# Patient Record
Sex: Male | Born: 1985 | Race: Black or African American | Hispanic: No | Marital: Single | Smoking: Former smoker
Health system: Southern US, Community
[De-identification: ages and names within clinical notes are randomized; demographics above are authoritative.]

---

## 2009-11-06 ENCOUNTER — Emergency Department (HOSPITAL_BASED_OUTPATIENT_CLINIC_OR_DEPARTMENT_OTHER): Admission: EM | Admit: 2009-11-06 | Discharge: 2009-11-06 | Payer: Self-pay | Admitting: Emergency Medicine

## 2009-11-06 ENCOUNTER — Ambulatory Visit: Payer: Self-pay | Admitting: Radiology
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
## 2015-01-29 ENCOUNTER — Encounter (HOSPITAL_BASED_OUTPATIENT_CLINIC_OR_DEPARTMENT_OTHER): Payer: Self-pay | Admitting: Emergency Medicine

## 2015-01-29 ENCOUNTER — Emergency Department (HOSPITAL_BASED_OUTPATIENT_CLINIC_OR_DEPARTMENT_OTHER)
Admission: EM | Admit: 2015-01-29 | Discharge: 2015-01-29 | Disposition: A | Discharge status: Home / Self Care | Payer: Self-pay | Attending: Emergency Medicine | Admitting: Emergency Medicine

## 2015-01-29 DIAGNOSIS — R197 Diarrhea, unspecified: Secondary | ICD-10-CM | POA: Insufficient documentation

## 2015-01-29 DIAGNOSIS — Z720 Tobacco use: Secondary | ICD-10-CM | POA: Insufficient documentation

## 2015-01-29 DIAGNOSIS — Z880 Allergy status to penicillin: Secondary | ICD-10-CM | POA: Insufficient documentation

## 2015-01-29 DIAGNOSIS — R110 Nausea: Secondary | ICD-10-CM | POA: Insufficient documentation

## 2015-01-29 MED ORDER — ONDANSETRON 4 MG PO TBDP
4.0000 mg | ORAL_TABLET | Freq: Once | ORAL | Status: AC
  Administered 2015-01-29: 4 mg via ORAL
  Filled 2015-01-29: qty 1

## 2015-01-29 MED ORDER — ONDANSETRON 4 MG PO TBDP: ORAL_TABLET | ORAL | Status: AC

## 2015-01-29 NOTE — ED Notes (Signed)
Pt having nausea and diarrhea since this am.  Pt and family not feeling well after eating the same food.  No known fever.  Some abdominal pain.  Pt took one dose of imodium this am.

## 2015-01-29 NOTE — ED Provider Notes (Signed)
CSN: [id number]     Arrival date & time 01/29/15  0917 History   First MD Initiated Contact with Patient 01/29/15 [phone]     Chief Complaint  Patient presents with  . Diarrhea     (Consider location/radiation/quality/duration/timing/severity/associated sxs/prior Treatment) HPI Comments: Patient presents with diarrhea. He states that the diarrhea started late last night. It's been watery and nonbloody. He's had a little nausea but no vomiting. He denies abdominal pain. He's had 2 other family members with the same symptoms. Denies any fevers or chills. He took one dose of Imodium this morning with some relief in symptoms. He denies any recent antibiotic usage.  Patient is a 29 y.o. male presenting with diarrhea.  Diarrhea Associated symptoms: no abdominal pain, no arthralgias, no chills, no diaphoresis, no fever, no headaches and no vomiting     No past medical history on file. No past surgical history on file. No family history on file. History  Substance Use Topics  . Smoking status: Current Every Day Smoker  . Smokeless tobacco: Not on file  . Alcohol Use: Not on file    Review of Systems  Constitutional: Negative for fever, chills, diaphoresis and fatigue.  HENT: Negative for congestion, rhinorrhea and sneezing.   Eyes: Negative.   Respiratory: Negative for cough, chest tightness and shortness of breath.   Cardiovascular: Negative for chest pain and leg swelling.  Gastrointestinal: Positive for nausea and diarrhea. Negative for vomiting, abdominal pain and blood in stool.  Genitourinary: Negative for frequency, hematuria, flank pain and difficulty urinating.  Musculoskeletal: Negative for back pain and arthralgias.  Skin: Negative for rash.  Neurological: Negative for dizziness, speech difficulty, weakness, numbness and headaches.      Allergies  Amoxicillin  Home Medications   Prior to Admission medications   Not on File   BP 119/67 mmHg  Pulse 54  Temp(Src) 98.2  F (36.8 C) (Oral)  Resp 18  Ht 6' (1.829 m)  Wt 185 lb (83.915 kg)  BMI 25.08 kg/m2  SpO2 100% Physical Exam  Constitutional: He is oriented to person, place, and time. He appears well-developed and well-nourished.  HENT:  Head: Normocephalic and atraumatic.  Eyes: Pupils are equal, round, and reactive to light.  Neck: Normal range of motion. Neck supple.  Cardiovascular: Normal rate, regular rhythm and normal heart sounds.   Pulmonary/Chest: Effort normal and breath sounds normal. No respiratory distress. He has no wheezes. He has no rales. He exhibits no tenderness.  Abdominal: Soft. Bowel sounds are normal. There is no tenderness. There is no rebound and no guarding.  Musculoskeletal: Normal range of motion. He exhibits no edema.  Lymphadenopathy:    He has no cervical adenopathy.  Neurological: He is alert and oriented to person, place, and time.  Skin: Skin is warm and dry. No rash noted.  Psychiatric: He has a normal mood and affect.    ED Course  Procedures (including critical care time) Labs Review Labs Reviewed - No data to display  Imaging Review No results found.   EKG Interpretation None      MDM   Final diagnoses:  Diarrhea    Patient is well-appearing. There is no signs of dehydration. His symptoms just started in the last 12 hours. I don't feel that blood work needs to be done. He has no associated abdominal pain. I advised them that we wouldn't do stool studies unless the symptoms have been going on for a week or so. He was given dose of Zofran  for the nausea and is feeling better with this. This is likely viral in nature. He was discharged home in good condition. Return precautions were given. He was advised to use a clear liquid diet for the next 48 hours.    Rolan Bucco, MD 01/29/15 [phone]

## 2015-01-29 NOTE — Discharge Instructions (Signed)

## 2023-10-10 ENCOUNTER — Emergency Department

## 2023-10-10 ENCOUNTER — Emergency Department
Admission: EM | Admit: 2023-10-10 | Discharge: 2023-10-10 | Disposition: A | Discharge status: Home / Self Care | Payer: Self-pay | Attending: Emergency Medicine | Admitting: Emergency Medicine

## 2023-10-10 ENCOUNTER — Encounter: Payer: Self-pay | Admitting: *Deleted

## 2023-10-10 ENCOUNTER — Other Ambulatory Visit: Payer: Self-pay

## 2023-10-10 DIAGNOSIS — S161XXA Strain of muscle, fascia and tendon at neck level, initial encounter: Secondary | ICD-10-CM | POA: Insufficient documentation

## 2023-10-10 DIAGNOSIS — Y92410 Unspecified street and highway as the place of occurrence of the external cause: Secondary | ICD-10-CM | POA: Insufficient documentation

## 2023-10-10 DIAGNOSIS — M50323 Other cervical disc degeneration at C6-C7 level: Secondary | ICD-10-CM | POA: Insufficient documentation

## 2023-10-10 DIAGNOSIS — S199XXA Unspecified injury of neck, initial encounter: Secondary | ICD-10-CM | POA: Diagnosis present

## 2023-10-10 MED ORDER — CYCLOBENZAPRINE HCL 10 MG PO TABS: 10.0000 mg | ORAL_TABLET | Freq: Three times a day (TID) | ORAL | 0 refills | Status: AC | PRN

## 2023-10-10 MED ORDER — MELOXICAM 15 MG PO TABS: 15.0000 mg | ORAL_TABLET | Freq: Every day | ORAL | 0 refills | Status: AC

## 2023-10-10 MED ORDER — CYCLOBENZAPRINE HCL 10 MG PO TABS
5.0000 mg | ORAL_TABLET | Freq: Once | ORAL | Status: AC
  Administered 2023-10-10: 5 mg via ORAL
  Filled 2023-10-10: qty 1
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
